# Patient Record
Sex: Female | Born: 1975 | Race: Black or African American | Hispanic: No | Smoking: Current every day smoker
Health system: Southern US, Community
[De-identification: ages and names within clinical notes are randomized; demographics above are authoritative.]

## PROBLEM LIST (undated history)

## (undated) DIAGNOSIS — F329 Major depressive disorder, single episode, unspecified: Secondary | ICD-10-CM

## (undated) DIAGNOSIS — F32A Depression, unspecified: Secondary | ICD-10-CM

## (undated) DIAGNOSIS — F419 Anxiety disorder, unspecified: Secondary | ICD-10-CM

## (undated) HISTORY — PX: CERVICAL SPINE SURGERY: SHX589

---
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
## 2019-05-16 ENCOUNTER — Encounter (HOSPITAL_COMMUNITY): Payer: Self-pay | Admitting: Emergency Medicine

## 2019-05-16 ENCOUNTER — Emergency Department (HOSPITAL_COMMUNITY)
Admission: EM | Admit: 2019-05-16 | Discharge: 2019-05-16 | Disposition: A | Discharge status: Home / Self Care | Payer: PRIVATE HEALTH INSURANCE | Attending: Emergency Medicine | Admitting: Emergency Medicine

## 2019-05-16 ENCOUNTER — Other Ambulatory Visit: Payer: Self-pay

## 2019-05-16 DIAGNOSIS — F17210 Nicotine dependence, cigarettes, uncomplicated: Secondary | ICD-10-CM | POA: Diagnosis not present

## 2019-05-16 DIAGNOSIS — M542 Cervicalgia: Secondary | ICD-10-CM | POA: Diagnosis not present

## 2019-05-16 DIAGNOSIS — M5410 Radiculopathy, site unspecified: Secondary | ICD-10-CM | POA: Diagnosis not present

## 2019-05-16 DIAGNOSIS — R202 Paresthesia of skin: Secondary | ICD-10-CM | POA: Diagnosis present

## 2019-05-16 HISTORY — DX: Depression, unspecified: F32.A

## 2019-05-16 HISTORY — DX: Anxiety disorder, unspecified: F41.9

## 2019-05-16 MED ORDER — PREDNISONE 10 MG (21) PO TBPK: ORAL_TABLET | Freq: Every day | ORAL | 0 refills | Status: DC

## 2019-05-16 NOTE — ED Notes (Signed)
Pt d/c home per MD order. Discharge summary reviewed, pt verbalizes understanding. E-signature not available. Pt off unit via WC. No s/s of acute distress.

## 2019-05-16 NOTE — ED Triage Notes (Signed)
Reports tingling in L arm from elbow to hand that started around 8:30am while on the way to a job interview.  After the interview she went to her cousin's house and started having pain in L side of neck with tingling down L arm.  Reports history of neck surgery.  No arm drift.

## 2019-05-16 NOTE — ED Provider Notes (Signed)
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  This is per the patient's report.  She reports that she had 2 different surgeries to help decompress and stabilize her spine including an anterior approach cervical fusion.  Her last surgery was approximately 8 months ago and she was doing very well until this morning when she woke up and had recurrent tingling from the elbow down through her hand.  She describes a stocking glove distribution, she denies any numbness of any other part of her body and has absolutely no weakness, no difficulty ambulating and no complaints of her legs.  On my exam the patient has totally normal strength at the shoulders biceps triceps wrist hand and fingers.  She has normal median, radial and ulnar nerve strength and the sensory is decreased in a stocking glove fashion from the distal upper extremity just above the elbow down through the hand.  It is unclear exactly what is causing this whether it is a peripheral neuropathy around the elbow or something more proximal around the brachial plexus or the neck however this does not appear to be a simple radiculopathy.  She also has absolutely no weakness and a normal exam otherwise suggesting that she can be seen at her spinal surgeon's office on 18 March, she already has this appointment lined up and will be traveling back to [place] in 1 week to prepare for that.  At this time I think it is reasonable to treat with steroids, she has been given very thorough directions on reasons to return and expressed her understanding.  I do not think she needs neurosurgical consultation at this time nor does she need a stat MRI.  Medical screening  examination/treatment/procedure(s) were conducted as a shared visit with non-physician practitioner(s) and myself.  I personally evaluated the patient during the encounter.  Clinical Impression:   Final diagnoses:  Neck pain  Radiculopathy, unspecified spinal region         Eber Hong, MD 05/17/19 2244

## 2019-05-16 NOTE — ED Provider Notes (Signed)
[facility] EMERGENCY DEPARTMENT Provider Note   CSN: [id number] Arrival date & time: 05/16/19  1208     History Chief Complaint  Patient presents with  . L arm tingling  . Neck Pain    Carolyn Chen is a 44 y.o. female.  HPI HPI Comments: Carolyn Chen is a 44 y.o. female who presents to the Emergency Department complaining of left arm tingling that started this morning.  Patient states that she was going to a job interview this morning and just prior began experiencing tingling in the left arm from the elbow to the fingertips.  Following this interview she went to a friend's house and noticed the tingling spread to her left shoulder.  She reports associated decreased sensation in the region.  She also reports associated left shoulder pain, headaches and left neck pain.  Her pain and tingling worsens with movement and palpation.  She reports a significant history of neck surgery with hardware in place.  Patient has a history of anxiety but denies she has been feeling any anxious symptoms today, even with her interview.  She denies any known history of diabetes.  She denies any other symptoms at this time.    Past Medical History:  Diagnosis Date  . Anxiety   . Depression     There are no problems to display for this patient.   Past Surgical History:  Procedure Laterality Date  . CERVICAL SPINE SURGERY       OB History   No obstetric history on file.     No family history on file.  Social History   Tobacco Use  . Smoking status: Current Every Day Smoker  . Smokeless tobacco: Never Used  Substance Use Topics  . Alcohol use: Not Currently  . Drug use: Not Currently    Home Medications Prior to Admission medications   Not on File    Allergies    Patient has no allergy information on record.  Review of Systems   Review of Systems  Constitutional: Negative for chills, fatigue and fever.  HENT: Negative for congestion and rhinorrhea.    Respiratory: Negative for shortness of breath.   Cardiovascular: Negative for chest pain and leg swelling.  Musculoskeletal: Positive for arthralgias, myalgias, neck pain and neck stiffness.  Neurological: Positive for numbness. Negative for dizziness, syncope, light-headedness and headaches.  Psychiatric/Behavioral: Negative for behavioral problems. The patient is not nervous/anxious and is not hyperactive.   All other systems reviewed and are negative.  Physical Exam Updated Vital Signs BP (!) 151/107 (BP Location: Right Arm)   Pulse 71   Temp 98.2 F (36.8 C) (Oral)   Resp 18   SpO2 100%   Physical Exam Vitals and nursing note reviewed.  Constitutional:      General: She is not in acute distress.    Appearance: Normal appearance. She is normal weight. She is not ill-appearing, toxic-appearing or diaphoretic.     Comments: Well-developed pleasant African-American female sitting upright and speaking clearly and coherently.  HENT:     Head: Normocephalic and atraumatic.     Right Ear: External ear normal.     Left Ear: External ear normal.     Nose: Nose normal. No congestion.  Eyes:     General: No scleral icterus.       Right eye: No discharge.        Left eye: No discharge.     Extraocular Movements: Extraocular movements intact.     Pupils: Pupils  are equal, round, and reactive to light.  Neck:     Comments: Well-healed linear surgical scar noted vertically to the posterior cervical spine.  Moderate midline cervical spine tenderness appreciated.  Moderate TTP noted additionally to the left cervical paraspinal musculature as well as the left posterior shoulder. Cardiovascular:     Rate and Rhythm: Normal rate and regular rhythm.     Pulses: Normal pulses.     Heart sounds: Normal heart sounds. No murmur. No friction rub. No gallop.   Pulmonary:     Effort: Pulmonary effort is normal. No respiratory distress.     Breath sounds: Normal breath sounds. No stridor. No  wheezing, rhonchi or rales.  Abdominal:     General: Abdomen is flat.     Tenderness: There is no abdominal tenderness.  Musculoskeletal:        General: Tenderness present.     Cervical back: Tenderness present.     Comments: Full range of motion of the bilateral upper extremities.  Skin:    General: Skin is warm and dry.  Neurological:     General: No focal deficit present.     Mental Status: She is alert and oriented to person, place, and time.     Cranial Nerves: No cranial nerve deficit.     Sensory: No sensory deficit.     Motor: No weakness.     Coordination: Coordination normal.     Gait: Gait normal.     Comments: Distal sensation intact in the bilateral upper extremities.  Patient able to discriminate light touch with eyes closed though states is decreased throughout the left upper extremity.  Strength is 5 out of 5 in the bilateral upper extremities.  Psychiatric:        Mood and Affect: Mood normal.        Behavior: Behavior normal.    ED Results / Procedures / Treatments   Labs (all labs ordered are listed, but only abnormal results are displayed) Labs Reviewed - No data to display  EKG None  Radiology No results found.  Procedures Procedures (including critical care time)  Medications Ordered in ED Medications - No data to display  ED Course  I have reviewed the triage vital signs and the nursing notes.  Pertinent labs & imaging results that were available during my care of the patient were reviewed by me and considered in my medical decision making (see chart for details).    MDM Rules/Calculators/A&P                      12:58 PM patient is a 44 year old African-American female that presents with neck pain, left shoulder pain, left arm paresthesias.  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  Physical exam is significant for subjective decrease sensation left upper extremity as well as  midline cervical spinal tenderness but otherwise has no abnormalities in strength or coordination in the bilateral upper extremities.  Discussed patient with my attending physician Dr. Eber Hong who also evaluated the patient.  Patient has a follow-up with her surgeon in 8 days.  Will provide prednisone burst and urged patient to follow-up with her surgeon in 1 week.  Patient given strict return precautions including but not limited to worsening severe pain, weakness, loss of coordination.  She verbalized understanding and was amicable at the time of discharge.  Vital signs stable at the time of discharge.  Neck  pain  Radiculopathy, unspecified spinal region  Rx / DC Orders ED Discharge Orders         Ordered    predniSONE (STERAPRED UNI-PAK 21 TAB) 10 MG (21) TBPK tablet  Daily     05/16/19 1328           Rayna Sexton, PA-C 05/16/19 1340    Noemi Chapel, MD 05/17/19 2244

## 2019-05-16 NOTE — Discharge Instructions (Signed)
You have been prescribed a 10-day prednisone taper.  This should help reduce the inflammation and hopefully reduce the tingling and pain in your neck, shoulder, and left arm.  If your symptoms worsen or if you experience any weakness or loss of coordination please do not hesitate to come back to the emergency department for reevaluation.  Please make sure you keep your appointment next week with your surgeon.  It was a pleasure meeting you

## 2019-11-13 ENCOUNTER — Other Ambulatory Visit: Payer: Self-pay

## 2019-11-13 ENCOUNTER — Emergency Department (HOSPITAL_COMMUNITY): Payer: PRIVATE HEALTH INSURANCE

## 2019-11-13 ENCOUNTER — Encounter (HOSPITAL_COMMUNITY): Payer: Self-pay | Admitting: Emergency Medicine

## 2019-11-13 ENCOUNTER — Emergency Department (HOSPITAL_COMMUNITY)
Admission: EM | Admit: 2019-11-13 | Discharge: 2019-11-14 | Disposition: A | Discharge status: Home / Self Care | Payer: PRIVATE HEALTH INSURANCE | Attending: Emergency Medicine | Admitting: Emergency Medicine

## 2019-11-13 DIAGNOSIS — Z5321 Procedure and treatment not carried out due to patient leaving prior to being seen by health care provider: Secondary | ICD-10-CM | POA: Insufficient documentation

## 2019-11-13 DIAGNOSIS — M79604 Pain in right leg: Secondary | ICD-10-CM | POA: Insufficient documentation

## 2019-11-13 MED ORDER — IBUPROFEN 400 MG PO TABS
400.0000 mg | ORAL_TABLET | Freq: Once | ORAL | Status: AC | PRN
  Administered 2019-11-13: 400 mg via ORAL
  Filled 2019-11-13: qty 1

## 2019-11-13 NOTE — ED Triage Notes (Signed)
Pt seen a month ago for muscle strain on left leg. Pain is now on right leg and states it feels like inside of her knee is popping. Denies any trauma. Pt has brace on.

## 2019-11-14 ENCOUNTER — Other Ambulatory Visit: Payer: Self-pay

## 2019-11-14 ENCOUNTER — Emergency Department (INDEPENDENT_AMBULATORY_CARE_PROVIDER_SITE_OTHER)
Admission: EM | Admit: 2019-11-14 | Discharge: 2019-11-14 | Disposition: A | Discharge status: Home / Self Care | Payer: Self-pay | Source: Home / Self Care | Attending: Family Medicine | Admitting: Family Medicine

## 2019-11-14 DIAGNOSIS — S838X1A Sprain of other specified parts of right knee, initial encounter: Secondary | ICD-10-CM

## 2019-11-14 MED ORDER — ACETAMINOPHEN 325 MG PO TABS
650.0000 mg | ORAL_TABLET | Freq: Once | ORAL | Status: AC
  Administered 2019-11-14: 650 mg via ORAL

## 2019-11-14 NOTE — ED Triage Notes (Signed)
Patient presents to Urgent Care with complaints of right knee pain since 3 days ago. Patient reports she went to the ED yesterday for same, denies trauma, states the pain started when she straightened her leg completely, felt it pop, has knee brace at home. X-ray done yesterday, LWBS due to 13-hour wait. Ace bandage in place upon arrival, feels like her knee keeps giving out from under her. Ice being applied at this time.

## 2019-11-14 NOTE — ED Provider Notes (Signed)
[facility]    CSN: [id number] Arrival date & time: 11/14/19  1627      History   Chief Complaint Chief Complaint  Patient presents with  . Knee Pain    HPI Carolyn Chen is a 44 y.o. female.   Three days ago patient extended her right knee and felt a painful popping sensation.  She began wearing a knee brace at home.  She went to a [facility] ED yesterday where X-rays of her knee were negative.  However, she left the facility without being fully evaluated because of a 13 hour wait.  She has a constant feeling of giving way in her right knee.  The history is provided by the patient.  Knee Pain Location:  Knee Time since incident:  3 days Injury: no   Knee location:  R knee Pain details:    Quality:  Aching   Radiates to:  Does not radiate   Severity:  Moderate   Onset quality:  Sudden   Duration:  3 days   Timing:  Intermittent   Progression:  Unchanged Chronicity:  New Prior injury to area:  No Worsened by:  Bearing weight Ineffective treatments: knee brace. Associated symptoms: decreased ROM and stiffness   Associated symptoms: no muscle weakness, no swelling and no tingling     Past Medical History:  Diagnosis Date  . Anxiety   . Depression     There are no problems to display for this patient.   Past Surgical History:  Procedure Laterality Date  . CERVICAL SPINE SURGERY      OB History   No obstetric history on file.      Home Medications    Prior to Admission medications   Medication Sig Start Date End Date Taking? Authorizing Provider  predniSONE (STERAPRED UNI-PAK 21 TAB) 10 MG (21) TBPK tablet Take by mouth daily. Take 6 tabs by mouth daily  for 2 days, then 5 tabs for 2 days, then 4 tabs for 2 days, then 3 tabs for 2 days, 2 tabs for 2 days, then 1 tab by mouth daily for 2 days 05/16/19   Placido Sou, PA-C    Family History Family History  Problem Relation Age of Onset  . Hypertension Mother   . Kidney disease Father      Social History Social History   Tobacco Use  . Smoking status: Current Every Day Smoker  . Smokeless tobacco: Never Used  Substance Use Topics  . Alcohol use: Not Currently  . Drug use: Not Currently     Allergies   Patient has no known allergies.   Review of Systems Review of Systems  Musculoskeletal: Positive for stiffness.  All other systems reviewed and are negative.    Physical Exam Triage Vital Signs ED Triage Vitals  Enc Vitals Group     BP 11/14/19 1652 (!) 153/105     Pulse Rate 11/14/19 1652 94     Resp 11/14/19 1652 16     Temp 11/14/19 1652 98.4 F (36.9 C)     Temp Source 11/14/19 1652 Oral     SpO2 11/14/19 1652 100 %     Weight --      Height --      Head Circumference --      Peak Flow --      Pain Score 11/14/19 1650 10     Pain Loc --      Pain Edu? --      Excl. in GC? --  No data found.  Updated Vital Signs BP (!) 155/100 (BP Location: Left Arm)   Pulse 94   Temp 98.4 F (36.9 C) (Oral)   Resp 16   SpO2 100%   Visual Acuity Right Eye Distance:   Left Eye Distance:   Bilateral Distance:    Right Eye Near:   Left Eye Near:    Bilateral Near:     Physical Exam Vitals and nursing note reviewed.  Constitutional:      General: She is not in acute distress. HENT:     Head: Normocephalic.     Nose: Nose normal.  Eyes:     Pupils: Pupils are equal, round, and reactive to light.  Cardiovascular:     Rate and Rhythm: Normal rate.  Pulmonary:     Effort: Pulmonary effort is normal.  Musculoskeletal:     Cervical back: Normal range of motion.     Right knee: No swelling, deformity, effusion, ecchymosis, bony tenderness or crepitus. Decreased range of motion. No LCL laxity, MCL laxity, ACL laxity or PCL laxity.     Instability Tests: Medial McMurray test positive.       Legs:     Comments: Medial McMurray test slightly positive in the right knee.  Skin:    General: Skin is warm and dry.  Neurological:     General: No  focal deficit present.     Mental Status: She is alert.      UC Treatments / Results  Labs (all labs ordered are listed, but only abnormal results are displayed) Labs Reviewed - No data to display  EKG   Radiology DG Knee Complete 4 Views Right  Result Date: 11/13/2019 CLINICAL DATA:  Knee pain popping sensation EXAM: RIGHT KNEE - COMPLETE 4+ VIEW COMPARISON:  None. FINDINGS: No evidence of fracture, dislocation, or joint effusion. No evidence of arthropathy or other focal bone abnormality. Soft tissues are unremarkable. IMPRESSION: Negative. Electronically Signed   By: Jasmine Pang M.D.   On: 11/13/2019 17:04    Procedures Procedures (including critical care time)  Medications Ordered in UC Medications  acetaminophen (TYLENOL) tablet 650 mg (650 mg Oral Given 11/14/19 1804)    Initial Impression / Assessment and Plan / UC Course  I have reviewed the triage vital signs and the nursing notes.  Pertinent labs & imaging results that were available during my care of the patient were reviewed by me and considered in my medical decision making (see chart for details).    Applied ace wrap and dispensed crutches. Followup with Dr. Rodney Langton ([facility]) for further evaluation/treatment.  Final Clinical Impressions(s) / UC Diagnoses   Final diagnoses:  Injury of meniscus of right knee, initial encounter     Discharge Instructions     Apply ice pack for 30 minutes 3 to 4 times daily until swelling decreases.  Use crutches until follow-up by Sports Medicine physician.  Wear Ace wrap until swelling decreases.  Wear knee brace.   Begin range of motion and stretching exercises in about 5 days as per instruction sheet.    ED Prescriptions    None        Lattie Haw, MD 11/17/19 1557

## 2019-11-14 NOTE — Discharge Instructions (Addendum)
Apply ice pack for 30 minutes 3 to 4 times daily until swelling decreases.  Use crutches until follow-up by Sports Medicine physician.  Wear Ace wrap until swelling decreases.  Wear knee brace.  May take Ibuprofen 200mg , 4 tabs every 8 hours with food.  Begin range of motion and stretching exercises in about 5 days as per instruction sheet.

## 2019-11-15 ENCOUNTER — Ambulatory Visit (INDEPENDENT_AMBULATORY_CARE_PROVIDER_SITE_OTHER): Payer: Medicaid Other | Admitting: Family Medicine

## 2019-11-15 ENCOUNTER — Ambulatory Visit: Payer: Self-pay

## 2019-11-15 ENCOUNTER — Encounter: Payer: Self-pay | Admitting: Family Medicine

## 2019-11-15 VITALS — BP 128/82 | HR 76 | Ht 67.0 in | Wt 152.6 lb

## 2019-11-15 DIAGNOSIS — S76111D Strain of right quadriceps muscle, fascia and tendon, subsequent encounter: Secondary | ICD-10-CM | POA: Insufficient documentation

## 2019-11-15 DIAGNOSIS — M25561 Pain in right knee: Secondary | ICD-10-CM

## 2019-11-15 DIAGNOSIS — S76111A Strain of right quadriceps muscle, fascia and tendon, initial encounter: Secondary | ICD-10-CM

## 2019-11-15 NOTE — Patient Instructions (Signed)
Nice to meet you Please try compression  You can continue the knee immobilizer  Please try alternating heat and ice  Please try the range of motion movements   Please send me a message in MyChart with any questions or updates.  Please see me back in 2 weeks.   --Dr. Jordan Likes

## 2019-11-15 NOTE — Progress Notes (Signed)
Dejae Bernet - 44 y.o. female MRN [id number]  Date of birth: April 22, 1975  SUBJECTIVE:  Including CC & ROS.  Chief Complaint  Patient presents with  . Knee Pain    right x 11/13/2019    Gionni Freese is a 44 y.o. female that is presenting with right quad pain.  Her symptoms started a few days ago.  She is having bruising in the medial lateral aspect of the quad.  She also has pain with moving her knee.  She did feel a pop in her knee.  Is unable to walk on the right side without significant pain..  Independent review of the right knee x-ray from 9/7 shows no acute abnormality.   Review of Systems See HPI   HISTORY: Past Medical, Surgical, Social, and Family History Reviewed & Updated per EMR.   Pertinent Historical Findings include:  Past Medical History:  Diagnosis Date  . Anxiety   . Depression     Past Surgical History:  Procedure Laterality Date  . CERVICAL SPINE SURGERY      Family History  Problem Relation Age of Onset  . Hypertension Mother   . Kidney disease Father     Social History   Socioeconomic History  . Marital status: Divorced    Spouse name: Not on file  . Number of children: Not on file  . Years of education: Not on file  . Highest education level: Not on file  Occupational History  . Not on file  Tobacco Use  . Smoking status: Current Every Day Smoker  . Smokeless tobacco: Never Used  Substance and Sexual Activity  . Alcohol use: Not Currently  . Drug use: Not Currently  . Sexual activity: Not on file  Other Topics Concern  . Not on file  Social History Narrative  . Not on file   Social Determinants of Health   Financial Resource Strain:   . Difficulty of Paying Living Expenses: Not on file  Food Insecurity:   . Worried About Programme researcher, broadcasting/film/video in the Last Year: Not on file  . Ran Out of Food in the Last Year: Not on file  Transportation Needs:   . Lack of Transportation (Medical): Not on file  . Lack of Transportation  (Non-Medical): Not on file  Physical Activity:   . Days of Exercise per Week: Not on file  . Minutes of Exercise per Session: Not on file  Stress:   . Feeling of Stress : Not on file  Social Connections:   . Frequency of Communication with Friends and Family: Not on file  . Frequency of Social Gatherings with Friends and Family: Not on file  . Attends Religious Services: Not on file  . Active Member of Clubs or Organizations: Not on file  . Attends Banker Meetings: Not on file  . Marital Status: Not on file  Intimate Partner Violence:   . Fear of Current or Ex-Partner: Not on file  . Emotionally Abused: Not on file  . Physically Abused: Not on file  . Sexually Abused: Not on file     PHYSICAL EXAM:  VS: BP 128/82   Pulse 76   Ht 5\' 7"  (1.702 m)   Wt 152 lb 9.3 oz (69.2 kg)   BMI 23.90 kg/m  Physical Exam Gen: NAD, alert, cooperative with exam, well-appearing MSK:  Right knee/leg: Ecchymosis occurring on the lateral and medial aspect. No effusion in the knee. Pain with extension. Pain with flexion. Neurovascularly intact  Limited  ultrasound: Right thigh/knee:  No effusion suprapatellar pouch. Normal-appearing quadricep and patellar tendon. Normal-appearing medial joint space. There appears to be a disruption and hyperemia of the mid substance of the sartorius. There is also increased hyperemia and change of the vastus medialis and vastus lateralis.  Summary: Findings suggest a quad tendon strain and adductor strain.  Ultrasound and interpretation by Clare Gandy, MD    ASSESSMENT & PLAN:   Quadriceps strain, right, initial encounter No changes appreciated in the knee itself.  There does appear to be changes of the sartorius, vastus medialis and vastus lateralis.  No changes appreciated of the quadriceps tendon.  It seems to be muscular in nature. -Counseled on compression and partial weightbearing. -Counseled on ibuprofen. -Can consider  physical therapy

## 2019-11-15 NOTE — Assessment & Plan Note (Signed)
No changes appreciated in the knee itself.  There does appear to be changes of the sartorius, vastus medialis and vastus lateralis.  No changes appreciated of the quadriceps tendon.  It seems to be muscular in nature. -Counseled on compression and partial weightbearing. -Counseled on ibuprofen. -Can consider physical therapy

## 2019-11-29 ENCOUNTER — Ambulatory Visit: Payer: Medicaid Other | Admitting: Family Medicine

## 2019-12-03 ENCOUNTER — Other Ambulatory Visit: Payer: Self-pay

## 2019-12-03 ENCOUNTER — Ambulatory Visit (INDEPENDENT_AMBULATORY_CARE_PROVIDER_SITE_OTHER): Payer: Medicaid Other | Admitting: Family Medicine

## 2019-12-03 ENCOUNTER — Encounter: Payer: Self-pay | Admitting: Family Medicine

## 2019-12-03 DIAGNOSIS — S76111D Strain of right quadriceps muscle, fascia and tendon, subsequent encounter: Secondary | ICD-10-CM

## 2019-12-03 NOTE — Progress Notes (Signed)
Carolyn Chen - 44 y.o. female MRN [id number]  Date of birth: [birth year]  SUBJECTIVE:  Including CC & ROS.  Chief Complaint  Patient presents with  . Follow-up    right quad    Carolyn Chen is a 44 y.o. female that is following up for the right leg pain.  Her symptoms have improved.  She only has minimal pain.  She is able to walk with little to no pain.  Has good range of motion.   Review of Systems See HPI   HISTORY: Past Medical, Surgical, Social, and Family History Reviewed & Updated per EMR.   Pertinent Historical Findings include:  Past Medical History:  Diagnosis Date  . Anxiety   . Depression     Past Surgical History:  Procedure Laterality Date  . CERVICAL SPINE SURGERY      Family History  Problem Relation Age of Onset  . Hypertension Mother   . Kidney disease Father     Social History   Socioeconomic History  . Marital status: Divorced    Spouse name: Not on file  . Number of children: Not on file  . Years of education: Not on file  . Highest education level: Not on file  Occupational History  . Not on file  Tobacco Use  . Smoking status: Current Every Day Smoker  . Smokeless tobacco: Never Used  Substance and Sexual Activity  . Alcohol use: Not Currently  . Drug use: Not Currently  . Sexual activity: Not on file  Other Topics Concern  . Not on file  Social History Narrative  . Not on file   Social Determinants of Health   Financial Resource Strain:   . Difficulty of Paying Living Expenses: Not on file  Food Insecurity:   . Worried About Programme researcher, broadcasting/film/video in the Last Year: Not on file  . Ran Out of Food in the Last Year: Not on file  Transportation Needs:   . Lack of Transportation (Medical): Not on file  . Lack of Transportation (Non-Medical): Not on file  Physical Activity:   . Days of Exercise per Week: Not on file  . Minutes of Exercise per Session: Not on file  Stress:   . Feeling of Stress : Not on file  Social Connections:    . Frequency of Communication with Friends and Family: Not on file  . Frequency of Social Gatherings with Friends and Family: Not on file  . Attends Religious Services: Not on file  . Active Member of Clubs or Organizations: Not on file  . Attends Banker Meetings: Not on file  . Marital Status: Not on file  Intimate Partner Violence:   . Fear of Current or Ex-Partner: Not on file  . Emotionally Abused: Not on file  . Physically Abused: Not on file  . Sexually Abused: Not on file     PHYSICAL EXAM:  VS: BP (!) 138/91   Pulse 69   Ht 5\' 7"  (1.702 m)   Wt 150 lb (68 kg)   BMI 23.49 kg/m  Physical Exam Gen: NAD, alert, cooperative with exam, well-appearing MSK:  Right thigh/knee: No ecchymosis or swelling. Normal range of motion. No effusion. No tenderness to palpation over the medial lateral compartments. Neurovascular intact     ASSESSMENT & PLAN:   Quadriceps strain, right, subsequent encounter Significantly improved to previous exam.  Possible for strain that is improving. -Counseled on home exercise therapy and supportive care. -Counseled on compression. -Follow-up as needed.

## 2019-12-03 NOTE — Assessment & Plan Note (Signed)
Significantly improved to previous exam.  Possible for strain that is improving. -Counseled on home exercise therapy and supportive care. -Counseled on compression. -Follow-up as needed.

## 2020-03-06 ENCOUNTER — Emergency Department (HOSPITAL_COMMUNITY)
Admission: EM | Admit: 2020-03-06 | Discharge: 2020-03-06 | Disposition: A | Discharge status: Home / Self Care | Payer: Medicaid Other | Attending: Emergency Medicine | Admitting: Emergency Medicine

## 2020-03-06 ENCOUNTER — Other Ambulatory Visit: Payer: Self-pay

## 2020-03-06 ENCOUNTER — Emergency Department (HOSPITAL_COMMUNITY): Payer: Medicaid Other

## 2020-03-06 ENCOUNTER — Ambulatory Visit
Admission: EM | Admit: 2020-03-06 | Discharge: 2020-03-06 | Disposition: A | Discharge status: Home / Self Care | Payer: Medicaid Other | Attending: Emergency Medicine | Admitting: Emergency Medicine

## 2020-03-06 ENCOUNTER — Encounter (HOSPITAL_COMMUNITY): Payer: Self-pay | Admitting: Emergency Medicine

## 2020-03-06 DIAGNOSIS — Z5321 Procedure and treatment not carried out due to patient leaving prior to being seen by health care provider: Secondary | ICD-10-CM | POA: Insufficient documentation

## 2020-03-06 DIAGNOSIS — M542 Cervicalgia: Secondary | ICD-10-CM | POA: Diagnosis not present

## 2020-03-06 MED ORDER — KETOROLAC TROMETHAMINE 30 MG/ML IJ SOLN
30.0000 mg | Freq: Once | INTRAMUSCULAR | Status: AC
  Administered 2020-03-06: 30 mg via INTRAMUSCULAR

## 2020-03-06 MED ORDER — TIZANIDINE HCL 4 MG PO TABS: 4.0000 mg | ORAL_TABLET | Freq: Four times a day (QID) | ORAL | 0 refills | Status: AC | PRN

## 2020-03-06 MED ORDER — OXYCODONE-ACETAMINOPHEN 5-325 MG PO TABS
1.0000 | ORAL_TABLET | Freq: Once | ORAL | Status: AC
  Administered 2020-03-06: 1 via ORAL
  Filled 2020-03-06: qty 1

## 2020-03-06 MED ORDER — NAPROXEN 500 MG PO TABS: 500.0000 mg | ORAL_TABLET | Freq: Two times a day (BID) | ORAL | 0 refills | Status: AC
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
## 2020-03-06 NOTE — Discharge Instructions (Addendum)
We gave you an injection of Toradol today Continue with Naprosyn twice daily with food Tizanidine to supplement at home, may cause some slight drowsiness Gentle stretching Alternate ice and heat Follow-up with neck doctor as planned

## 2020-03-06 NOTE — ED Notes (Signed)
Pt advised [facility] staff she was leaving.

## 2020-09-13 ENCOUNTER — Inpatient Hospital Stay
Admit: 2020-09-13 | Discharge: 2020-09-13 | Disposition: A | Discharge status: Home / Self Care | Payer: MEDICARE | Attending: Emergency Medicine

## 2020-09-13 ENCOUNTER — Emergency Department: Admit: 2020-09-13 | Payer: MEDICARE

## 2020-09-13 DIAGNOSIS — S161XXA Strain of muscle, fascia and tendon at neck level, initial encounter: Secondary | ICD-10-CM

## 2020-09-13 MED ORDER — TRAMADOL HCL 50 MG PO TABS
50 MG | Freq: Once | ORAL | Status: AC
Start: 2020-09-13 — End: 2020-09-13
  Administered 2020-09-13: 19:00:00 50 mg via ORAL

## 2020-09-13 MED ORDER — DEXAMETHASONE 4 MG PO TABS
4 MG | Freq: Once | ORAL | Status: AC
Start: 2020-09-13 — End: 2020-09-13
  Administered 2020-09-13: 19:00:00 8 mg via ORAL

## 2020-09-13 MED ORDER — CYCLOBENZAPRINE HCL 10 MG PO TABS
10 MG | ORAL_TABLET | Freq: Three times a day (TID) | ORAL | 0 refills | Status: AC | PRN
Start: 2020-09-13 — End: 2020-09-20

## 2020-09-13 MED ORDER — PREDNISONE 20 MG PO TABS
20 MG | ORAL_TABLET | Freq: Every day | ORAL | 0 refills | Status: AC
Start: 2020-09-13 — End: 2020-09-18

## 2020-09-13 MED ORDER — TRAMADOL HCL 50 MG PO TABS
50 MG | ORAL_TABLET | Freq: Three times a day (TID) | ORAL | 0 refills | Status: AC | PRN
Start: 2020-09-13 — End: 2020-09-18

## 2020-09-13 MED FILL — DEXAMETHASONE 4 MG PO TABS: 4 mg | ORAL | Qty: 2

## 2020-09-13 MED FILL — TRAMADOL HCL 50 MG PO TABS: 50 mg | ORAL | Qty: 1
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
## 2020-09-13 NOTE — ED Triage Notes (Signed)
Pt to ER with c/o left sided neck pain since Sunday, pt states she felt a crack in her neck and pain since then states she tried to wait a few days to see if pain would resolve on its own. Pt denies taking anything for pain today. Pt states last neck surgery 2 years ago and that she was told neck fusion was successful. Pt states she is from out of town visiting family.

## 2020-09-13 NOTE — ED Notes (Signed)
Pt to CT ambulatory with steady gait     Kerby Less, RN  09/13/20 1501
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
## 2022-06-23 ENCOUNTER — Encounter: Payer: Self-pay | Admitting: *Deleted
